# Patient Record
Sex: Female | Born: 1990 | Race: Black or African American | Hispanic: No | Marital: Single | Smoking: Current every day smoker
Health system: Southern US, Community
[De-identification: ages and names within clinical notes are randomized; demographics above are authoritative.]

## PROBLEM LIST (undated history)

## (undated) DIAGNOSIS — J45909 Unspecified asthma, uncomplicated: Secondary | ICD-10-CM

---

## 2014-08-17 ENCOUNTER — Encounter (HOSPITAL_COMMUNITY): Payer: Self-pay | Admitting: *Deleted

## 2014-08-17 ENCOUNTER — Emergency Department (HOSPITAL_COMMUNITY): Payer: Medicaid - Out of State

## 2014-08-17 ENCOUNTER — Emergency Department (INDEPENDENT_AMBULATORY_CARE_PROVIDER_SITE_OTHER)
Admission: EM | Admit: 2014-08-17 | Discharge: 2014-08-17 | Disposition: A | Discharge status: Nursing Home (Includes ICF) | Payer: PRIVATE HEALTH INSURANCE | Source: Home / Self Care

## 2014-08-17 ENCOUNTER — Encounter (HOSPITAL_COMMUNITY): Payer: Self-pay | Admitting: Emergency Medicine

## 2014-08-17 ENCOUNTER — Emergency Department (HOSPITAL_COMMUNITY)
Admission: EM | Admit: 2014-08-17 | Discharge: 2014-08-17 | Disposition: A | Discharge status: Home / Self Care | Payer: Medicaid - Out of State | Attending: Emergency Medicine | Admitting: Emergency Medicine

## 2014-08-17 DIAGNOSIS — R1011 Right upper quadrant pain: Secondary | ICD-10-CM | POA: Insufficient documentation

## 2014-08-17 DIAGNOSIS — Z720 Tobacco use: Secondary | ICD-10-CM | POA: Diagnosis not present

## 2014-08-17 DIAGNOSIS — Z79899 Other long term (current) drug therapy: Secondary | ICD-10-CM | POA: Diagnosis not present

## 2014-08-17 DIAGNOSIS — J45901 Unspecified asthma with (acute) exacerbation: Secondary | ICD-10-CM | POA: Diagnosis not present

## 2014-08-17 DIAGNOSIS — R0602 Shortness of breath: Secondary | ICD-10-CM

## 2014-08-17 HISTORY — DX: Unspecified asthma, uncomplicated: J45.909

## 2014-08-17 LAB — CBC WITH DIFFERENTIAL/PLATELET
Basophils Absolute: 0 10*3/uL (ref 0.0–0.1)
Basophils Relative: 0 % (ref 0–1)
Eosinophils Absolute: 0.1 10*3/uL (ref 0.0–0.7)
Eosinophils Relative: 1 % (ref 0–5)
HCT: 40.5 % (ref 36.0–46.0)
Hemoglobin: 13.8 g/dL (ref 12.0–15.0)
Lymphocytes Relative: 37 % (ref 12–46)
Lymphs Abs: 2.6 10*3/uL (ref 0.7–4.0)
MCH: 28.6 pg (ref 26.0–34.0)
MCHC: 34.1 g/dL (ref 30.0–36.0)
MCV: 84 fL (ref 78.0–100.0)
Monocytes Absolute: 0.4 10*3/uL (ref 0.1–1.0)
Monocytes Relative: 6 % (ref 3–12)
Neutro Abs: 3.8 10*3/uL (ref 1.7–7.7)
Neutrophils Relative %: 56 % (ref 43–77)
Platelets: 201 10*3/uL (ref 150–400)
RBC: 4.82 MIL/uL (ref 3.87–5.11)
RDW: 12.7 % (ref 11.5–15.5)
WBC: 6.9 10*3/uL (ref 4.0–10.5)

## 2014-08-17 LAB — COMPREHENSIVE METABOLIC PANEL
ALT: 16 U/L (ref 0–35)
AST: 20 U/L (ref 0–37)
Albumin: 4.5 g/dL (ref 3.5–5.2)
Alkaline Phosphatase: 38 U/L — ABNORMAL LOW (ref 39–117)
Anion gap: 10 (ref 5–15)
BUN: 7 mg/dL (ref 6–23)
CO2: 23 mmol/L (ref 19–32)
Calcium: 9.7 mg/dL (ref 8.4–10.5)
Chloride: 104 mmol/L (ref 96–112)
Creatinine, Ser: 0.85 mg/dL (ref 0.50–1.10)
GFR calc Af Amer: >90 mL/min (ref >90)
GFR calc non Af Amer: >90 mL/min (ref >90)
Glucose, Bld: 97 mg/dL (ref 70–99)
Potassium: 3.9 mmol/L (ref 3.5–5.1)
Sodium: 137 mmol/L (ref 135–145)
Total Bilirubin: 1.9 mg/dL — ABNORMAL HIGH (ref 0.3–1.2)
Total Protein: 7.7 g/dL (ref 6.0–8.3)

## 2014-08-17 LAB — URINALYSIS, ROUTINE W REFLEX MICROSCOPIC
Bilirubin Urine: NEGATIVE
Glucose, UA: NEGATIVE mg/dL
Hgb urine dipstick: NEGATIVE
Ketones, ur: >80 mg/dL — AB
Leukocytes, UA: NEGATIVE
Nitrite: NEGATIVE
Protein, ur: NEGATIVE mg/dL
Specific Gravity, Urine: 1.025 (ref 1.005–1.030)
Urobilinogen, UA: 0.2 mg/dL (ref 0.0–1.0)
pH: 6.5 (ref 5.0–8.0)

## 2014-08-17 LAB — POCT URINALYSIS DIP (DEVICE)
Glucose, UA: NEGATIVE mg/dL
Hgb urine dipstick: NEGATIVE
KETONES UR: 80 mg/dL — AB
Nitrite: NEGATIVE
PROTEIN: NEGATIVE mg/dL
Specific Gravity, Urine: 1.025 (ref 1.005–1.030)
UROBILINOGEN UA: 0.2 mg/dL (ref 0.0–1.0)
pH: 7 (ref 5.0–8.0)

## 2014-08-17 LAB — I-STAT BETA HCG BLOOD, ED (MC, WL, AP ONLY): I-stat hCG, quantitative: <5 m[IU]/mL (ref <5)

## 2014-08-17 LAB — LIPASE, BLOOD: Lipase: 26 U/L (ref 11–59)

## 2014-08-17 LAB — POCT PREGNANCY, URINE: PREG TEST UR: NEGATIVE

## 2014-08-17 MED ORDER — ONDANSETRON HCL 4 MG/2ML IJ SOLN
4.0000 mg | Freq: Once | INTRAMUSCULAR | Status: AC
  Administered 2014-08-17: 4 mg via INTRAVENOUS
  Filled 2014-08-17: qty 2

## 2014-08-17 MED ORDER — ONDANSETRON HCL 4 MG PO TABS: 4.0000 mg | ORAL_TABLET | Freq: Four times a day (QID) | ORAL | Status: AC

## 2014-08-17 MED ORDER — MORPHINE SULFATE 4 MG/ML IJ SOLN
4.0000 mg | Freq: Once | INTRAMUSCULAR | Status: AC
  Administered 2014-08-17: 4 mg via INTRAVENOUS
  Filled 2014-08-17: qty 1

## 2014-08-17 MED ORDER — HYDROCODONE-ACETAMINOPHEN 5-325 MG PO TABS: 1.0000 | ORAL_TABLET | Freq: Four times a day (QID) | ORAL | Status: AC | PRN

## 2014-08-17 NOTE — Discharge Instructions (Signed)
Return to the Emergency Department if you develop strong pain in the right lower part of your abdomen or if the pain in the right upper abdomen becomes unbearable.  Take pain medication as prescribed.  Do not drive or operate heavy machinery for 4-6 hours after taking pain medication.

## 2014-08-17 NOTE — ED Notes (Addendum)
Pt reports pain in upper right quadrant this morning with sob and loose stools for the past two days.  [facility] and was sent here for further evaluation of gall bladder.  Pt alert and oriented.

## 2014-08-17 NOTE — ED Notes (Signed)
Pt is here with complaints of sudden onset of pain under right breast last night radiating to right back. Pt denies any additional symptoms. Pt is A&O X 3, no signs of acute distress. Resp even and non labored.

## 2014-08-17 NOTE — ED Provider Notes (Signed)
CSN: [id number]     Arrival date & time 08/17/14  1844 History   First MD Initiated Contact with Patient 08/17/14 1901     Chief Complaint  Patient presents with  . Abdominal Pain     (Consider location/radiation/quality/duration/timing/severity/associated sxs/prior Treatment) HPI Comments: Patient presents today from [facility] for further evaluation of RUQ abdominal pain.  She reports that she began having the pain last evening.  The pain worsened this morning andwoke her up from sleep.  Pain located in the RUQ of the abdomen and does not radiate.  She states that she has never had pain like this before.  No history of Gallstones.  She states that she has been nauseous today so she has not been eating.  Therefore, she is unsure if the pain worsens after eating.  She has not taken anything for pain prior to arrival.  She reports associated nausea and SOB.  She denies vomiting, fever, chills, cough, chest pain, urinary symptoms, or diarrhea.   Patient is a 24 y.o. female presenting with abdominal pain. The history is provided by the patient.  Abdominal Pain   Past Medical History  Diagnosis Date  . Asthma     as a child   History reviewed. No pertinent past surgical history. History reviewed. No pertinent family history. History  Substance Use Topics  . Smoking status: Current Every Day Smoker -- 1.00 packs/day  . Smokeless tobacco: Not on file  . Alcohol Use: Yes   OB History    No data available     Review of Systems  Gastrointestinal: Positive for abdominal pain.  All other systems reviewed and are negative.     Allergies  Sulfa antibiotics  Home Medications   Prior to Admission medications   Medication Sig Start Date End Date Taking? Authorizing Provider  medroxyPROGESTERone (DEPO-PROVERA) 150 MG/ML injection Inject 150 mg into the muscle every 3 (three) months. 05/10/14  Yes Historical Provider, MD   BP 127/89 mmHg  Pulse 79  Temp(Src) 97.9 F (36.6 C) (Oral)  Resp 18   SpO2 100%  LMP  (LMP Unknown) Physical Exam  Constitutional: She appears well-developed and well-nourished. No distress.  HENT:  Head: Normocephalic and atraumatic.  Mouth/Throat: Oropharynx is clear and moist.  Neck: Normal range of motion. Neck supple.  Cardiovascular: Normal rate, regular rhythm and normal heart sounds.   Pulmonary/Chest: Effort normal and breath sounds normal. No respiratory distress. She has no wheezes. She has no rales.  Abdominal: Soft. Bowel sounds are normal. She exhibits no distension and no mass. There is tenderness in the right upper quadrant. There is no rigidity, no rebound, no guarding and negative Murphy's sign.  Musculoskeletal: Normal range of motion.  Neurological: She is alert.  Skin: Skin is warm and dry. She is not diaphoretic.  Psychiatric: She has a normal mood and affect.  Nursing note and vitals reviewed.   ED Course  Procedures (including critical care time) Labs Review Labs Reviewed  COMPREHENSIVE METABOLIC PANEL - Abnormal; Notable for the following:    Alkaline Phosphatase 38 (*)    Total Bilirubin 1.9 (*)    All other components within normal limits  URINALYSIS, ROUTINE W REFLEX MICROSCOPIC - Abnormal; Notable for the following:    Ketones, ur >80 (*)    All other components within normal limits  CBC WITH DIFFERENTIAL/PLATELET  LIPASE, BLOOD  I-STAT BETA HCG BLOOD, ED ([facility], [facility], [facility] ONLY)    Imaging Review Dg Chest 2 View  08/17/2014   CLINICAL  DATA:  Shortness of breath and right lower chest pain radiating to the patient's back since this morning. Occasional smoker.  EXAM: CHEST  2 VIEW  COMPARISON:  None.  FINDINGS: Pulmonary hyperinflation. The heart size and mediastinal contours are within normal limits. Both lungs are clear. The visualized skeletal structures are unremarkable.  IMPRESSION: No active cardiopulmonary disease.   Electronically Signed   By: Burman NievesWilliam  Stevens M.D.   On: 08/17/2014 19:47   [place] Abdomen  Complete  08/17/2014   CLINICAL DATA:  Initial evaluation for acute abdominal pain.  EXAM: ULTRASOUND ABDOMEN COMPLETE  COMPARISON:  None.  FINDINGS: Gallbladder: No gallstones or wall thickening visualized. No sonographic Murphy sign noted.  Common bile duct: Diameter: 2.3 mm  Liver: No focal lesion identified. Within normal limits in parenchymal echogenicity.  IVC: No abnormality visualized.  Pancreas: Visualized portion unremarkable.  Spleen: Size and appearance within normal limits.  Right Kidney: Length: 9.2 cm. Echogenicity within normal limits. No mass or hydronephrosis visualized.  Left Kidney: Length: 10.2 cm. Echogenicity within normal limits. No mass or hydronephrosis visualized.  Abdominal aorta: No aneurysm visualized.  Other findings: None.  IMPRESSION: Normal abdominal ultrasound with no acute abnormality identified.   Electronically Signed   By: Rise MuBenjamin  McClintock M.D.   On: 08/17/2014 20:58     EKG Interpretation None     9:29 PM Reassessed patient.  Pain and nausea have improved.  Abdomen soft with mild tenderness to palpation of the RUQ.  No rebound or guarding. MDM   Final diagnoses:  SOB (shortness of breath)   Patient presents today with a chief complaint of RUQ abdominal pain.  Pain has been present since his morning.  Pain associated with some nausea.  Labs unremarkable aside from mildly elevated total bilirubin of 1.9.  Abdominal ultrasound is negative.  Urine pregnancy negative.  Pain and nausea controlled in the ED.  Patient given referral to GI.  Stable for discharge.  Return precautions given.    Santiago GladHeather Sophee Mckimmy, PA-C 08/18/14 [id number]  Raeford RazorStephen Kohut, MD 08/22/14 [phone]

## 2014-08-17 NOTE — ED Notes (Signed)
Pt c/o RUQ pain x 2 days; [facility]

## 2014-08-17 NOTE — ED Provider Notes (Signed)
CSN: [id number]     Arrival date & time 08/17/14  1500 History   None    Chief Complaint  Patient presents with  . Chest Pain  . Back Pain  . Cystitis   (Consider location/radiation/quality/duration/timing/severity/associated sxs/prior Treatment)  HPI   The patient is a 24 year old female presenting tonight with complaints of right upper quadrant and "side pain"  The patient states that her discomfort radiates through towards her back and into her right shoulder. She rates her pain as a 7-8/10 on scale. In addition, she states she has some lower abdominal cramping, but severe pain is centered in the right upper quadrant. She states the pain was so severe it woke her up from a sound sleep at approximately 12:30 today. She went to sleep at 7 AM this morning. Denies any history of hematuria, dysuria, frequency, vaginal discharge or odor. Last menstrual period was 05/10/2014. Patient is on Depo-Provera and has not had regular periods. Denies nausea, vomiting, and diarrhea. States her last bowel movement was this morning and "normal for her". Denies headache or neck stiffness.  Past Medical History  Diagnosis Date  . Asthma     as a child   History reviewed. No pertinent past surgical history. No family history on file. History  Substance Use Topics  . Smoking status: Current Every Day Smoker -- 1.00 packs/day  . Smokeless tobacco: Not on file  . Alcohol Use: Yes   OB History    No data available     Review of Systems  Constitutional: Negative.  Negative for fever, chills and fatigue.  HENT: Negative.   Eyes: Negative.   Respiratory: Negative.  Negative for cough, chest tightness, shortness of breath and wheezing.   Gastrointestinal: Positive for abdominal pain. Negative for nausea, vomiting, diarrhea, constipation and abdominal distention.  Endocrine: Negative.   Genitourinary: Negative for dysuria, urgency, frequency, hematuria, flank pain, decreased urine volume, vaginal  discharge, difficulty urinating, genital sores, menstrual problem and pelvic pain.  Skin: Negative.  Negative for rash.  Allergic/Immunologic: Negative.   Neurological: Negative.   Hematological: Negative.   Psychiatric/Behavioral: Negative.     Allergies  Sulfa antibiotics  Home Medications   Prior to Admission medications   Not on File   BP 113/77 mmHg  Pulse 79  Temp(Src) 97.9 F (36.6 C) (Oral)  Resp 16  SpO2 100%  LMP  (LMP Unknown)   Physical Exam  Constitutional: She appears well-developed and well-nourished.  The patient is rocking back and forth on the end of the examination table and appears to be acutely uncomfortable.  Neck: Normal range of motion. Neck supple.  Negative nuchal rigidity.  Cardiovascular: Normal rate, regular rhythm, normal heart sounds and intact distal pulses.  Exam reveals no gallop and no friction rub.   No murmur heard. Pulmonary/Chest: Effort normal and breath sounds normal. No respiratory distress. She has no wheezes. She has no rales. She exhibits no tenderness.  No adventitious breath sounds noted. Air exchange heard in all fields.  Abdominal: Soft. Normal appearance and bowel sounds are normal. She exhibits no distension and no mass. There is no hepatosplenomegaly, splenomegaly or hepatomegaly. There is tenderness in the right upper quadrant. There is positive Murphy's sign. There is no rigidity, no rebound, no CVA tenderness and no tenderness at McBurney's point.  Patient describes pain as under her R breast.   Skin: Skin is warm and dry. She is not diaphoretic. No pallor.  Nursing note and vitals reviewed.   ED Course  Procedures (including critical care time) Labs Review Labs Reviewed  POCT URINALYSIS DIP (DEVICE) - Abnormal; Notable for the following:    Bilirubin Urine SMALL (*)    Ketones, ur 80 (*)    Leukocytes, UA SMALL (*)    All other components within normal limits  POCT PREGNANCY, URINE   Results for orders placed or  performed during the hospital encounter of 08/17/14  POCT urinalysis dip (device)  Result Value Ref Range   Glucose, UA NEGATIVE NEGATIVE mg/dL   Bilirubin Urine SMALL (A) NEGATIVE   Ketones, ur 80 (A) NEGATIVE mg/dL   Specific Gravity, Urine 1.025 1.005 - 1.030   Hgb urine dipstick NEGATIVE NEGATIVE   pH 7.0 5.0 - 8.0   Protein, ur NEGATIVE NEGATIVE mg/dL   Urobilinogen, UA 0.2 0.0 - 1.0 mg/dL   Nitrite NEGATIVE NEGATIVE   Leukocytes, UA SMALL (A) NEGATIVE  Pregnancy, urine POC  Result Value Ref Range   Preg Test, Ur NEGATIVE NEGATIVE    Imaging Review No results found.   MDM   1. Right upper quadrant pain    I am concerned patient has gallbladder disease.  [facility] evaluation and workup.      Servando Salinaatherine H Daimien Patmon, NP 08/17/14 [phone]

## 2015-08-09 IMAGING — US US ABDOMEN COMPLETE
1 series · 14 of 25 positions shown · non-contrast
Comparison: None.

CLINICAL DATA: Initial evaluation for acute abdominal pain.

EXAM:
ULTRASOUND ABDOMEN COMPLETE

[Series 1: us abdomen complete · 0.21mm/px · 14 of 44 slices shown]
[im 1/44]
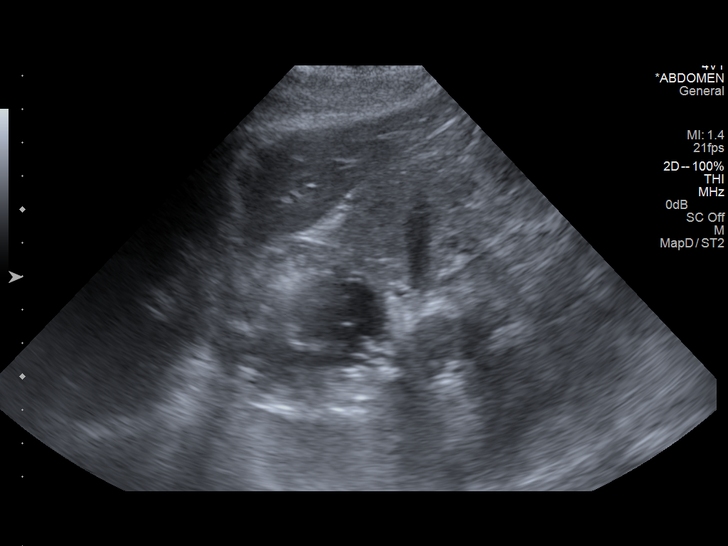
[im 4/44]
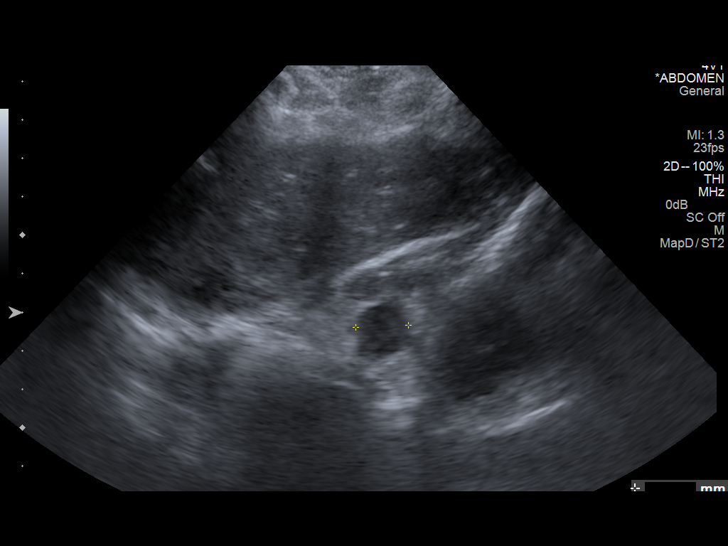
[im 8/44]
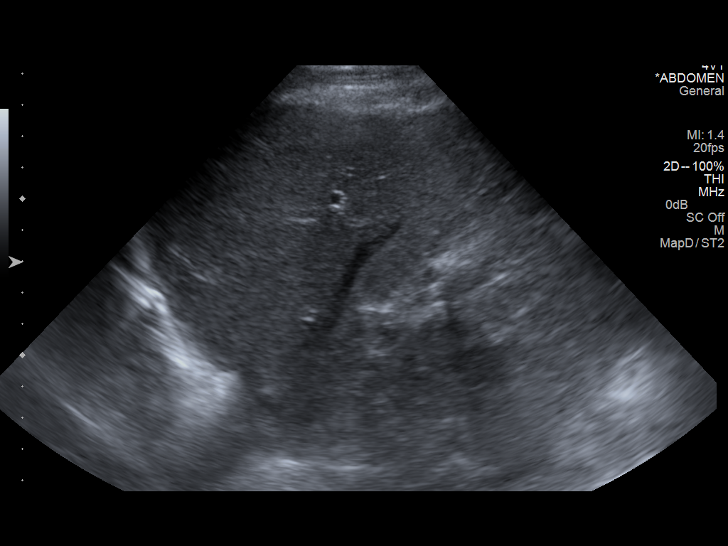
[im 11/44]
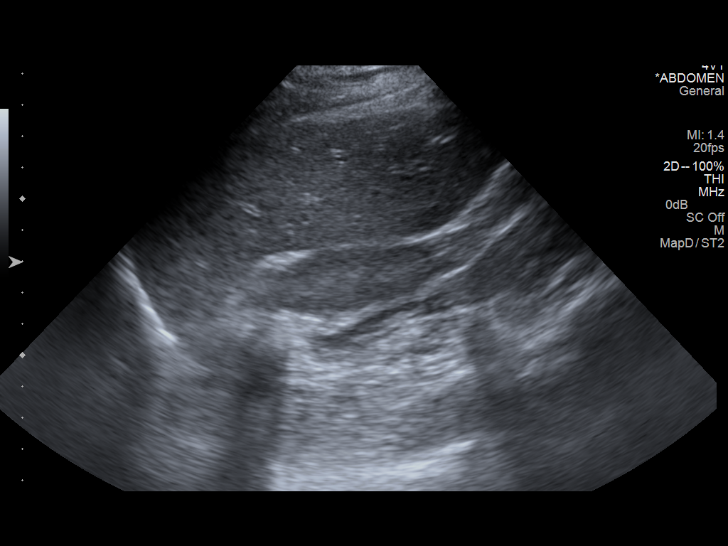
[im 15/44]
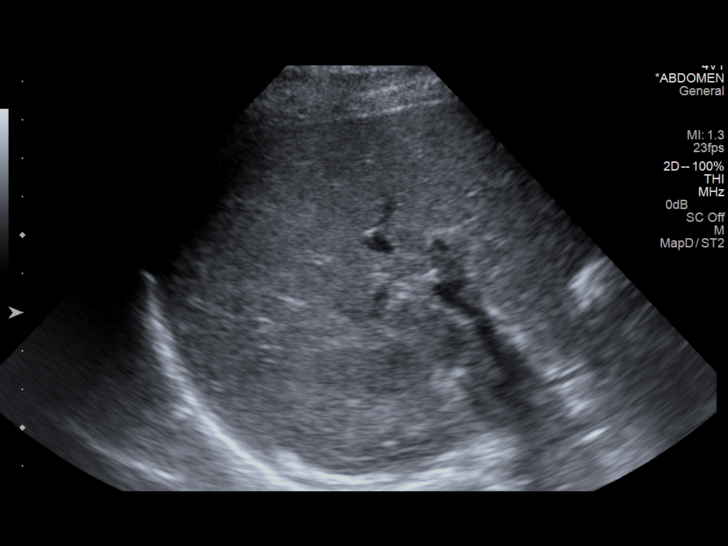
[im 17/44]
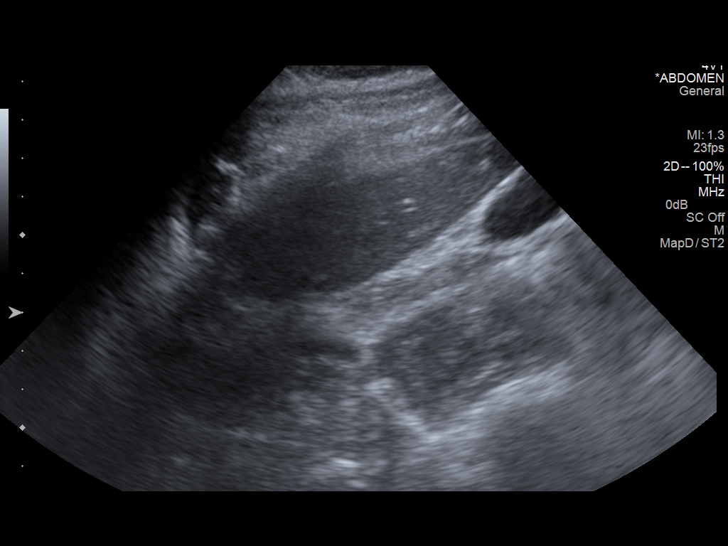
[im 20/44]
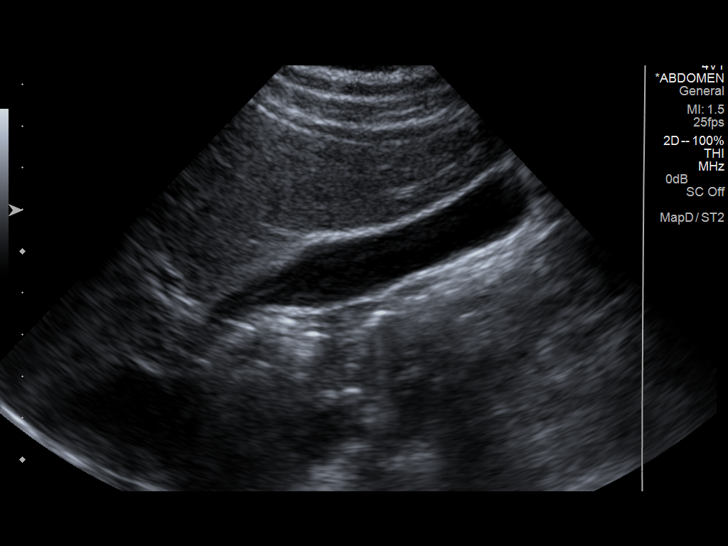
[im 24/44]
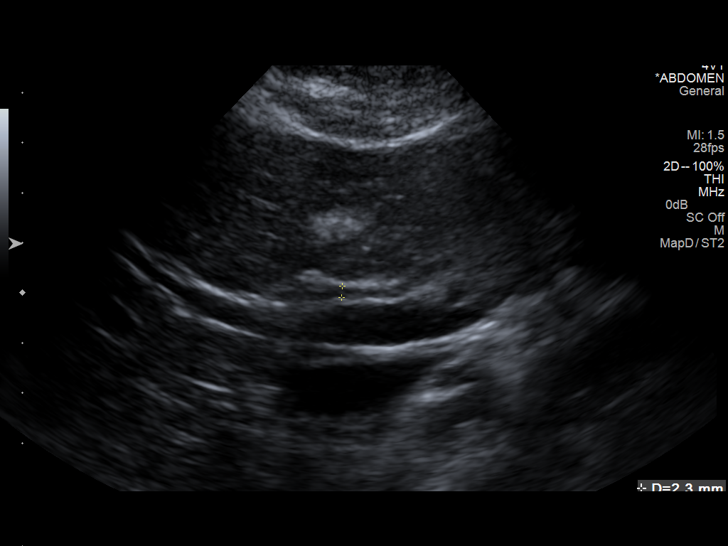
[im 27/44]
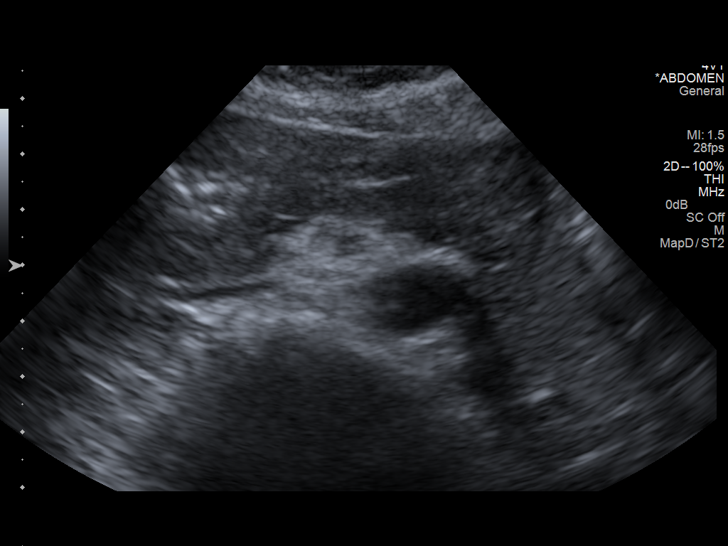
[im 29/44]
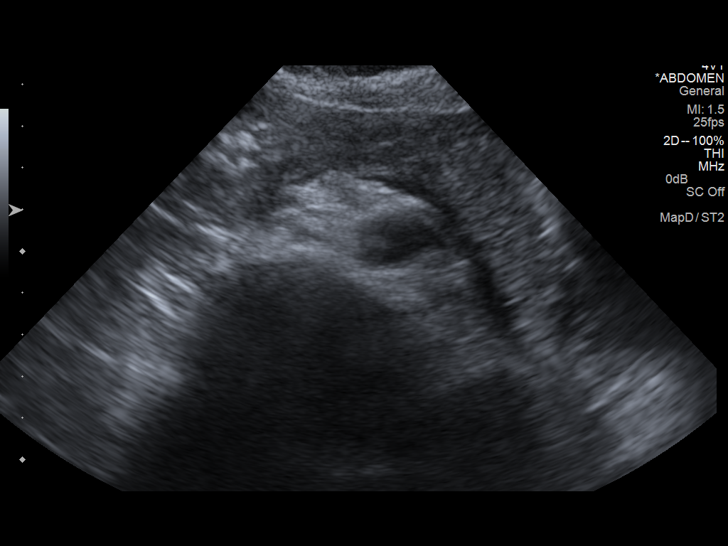
[im 33/44]
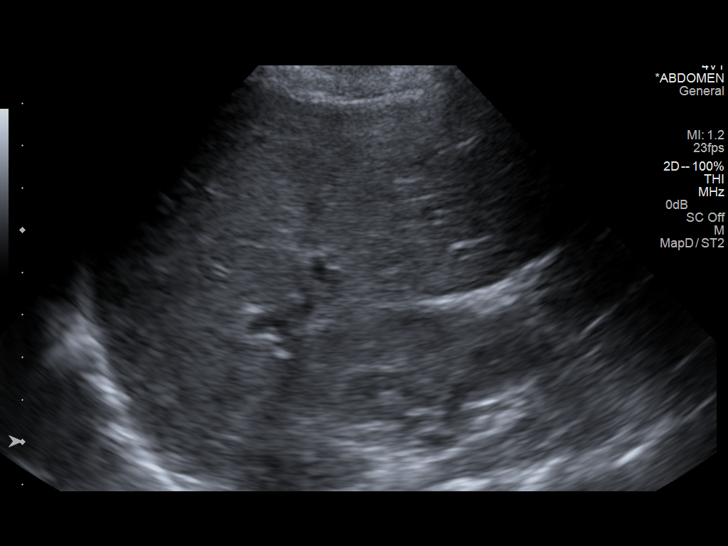
[im 36/44]
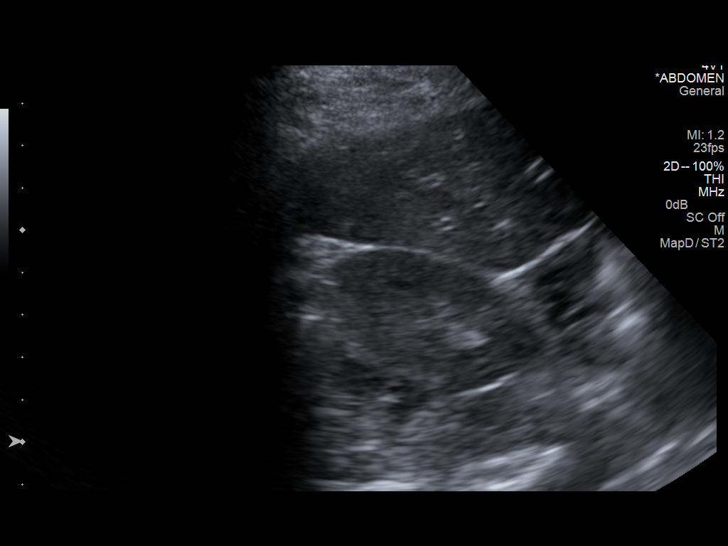
[im 40/44]
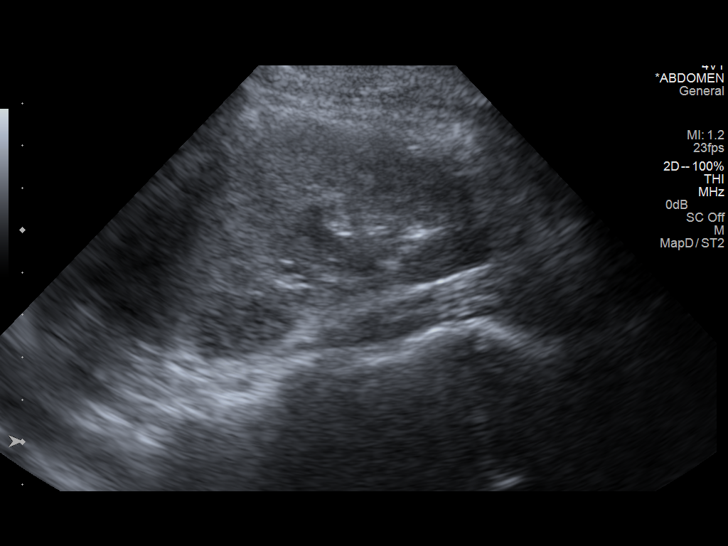
[im 44/44]
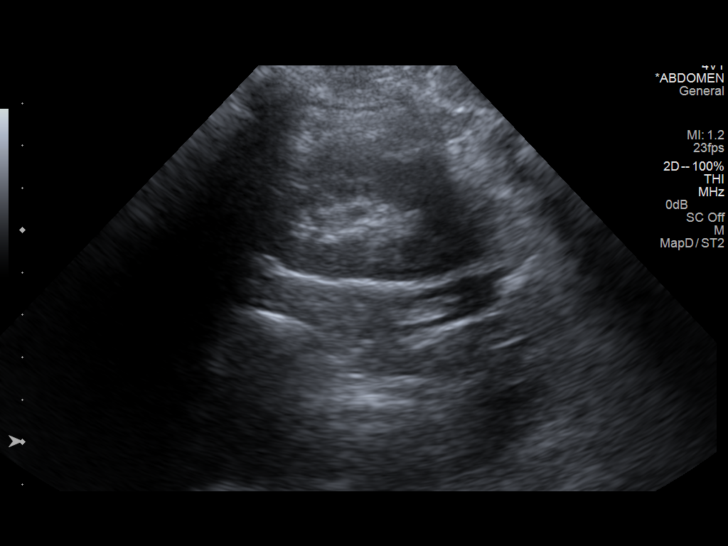

[14 of 25 positions shown; findings below may reference images not displayed]

FINDINGS: Gallbladder: No gallstones or wall thickening visualized. No
sonographic Murphy sign noted.

Common bile duct: Diameter: 2.3 mm

Liver: No focal lesion identified. Within normal limits in
parenchymal echogenicity.

IVC: No abnormality visualized.

Pancreas: Visualized portion unremarkable.

Spleen: Size and appearance within normal limits.

Right Kidney: Length: 9.2 cm. Echogenicity within normal limits. No
mass or hydronephrosis visualized.

Left Kidney: Length: 10.2 cm. Echogenicity within normal limits. No
mass or hydronephrosis visualized.

Abdominal aorta: No aneurysm visualized.

Other findings: None.
IMPRESSION: Normal abdominal ultrasound with no acute abnormality identified.

## 2015-08-09 IMAGING — DX DG CHEST 2V
2 series · 2 of 2 positions shown · non-contrast
Comparison: None.

CLINICAL DATA: Shortness of breath and right lower chest pain
radiating to the patient's back since this morning. Occasional
smoker.

EXAM:
CHEST  2 VIEW

[chest pa]
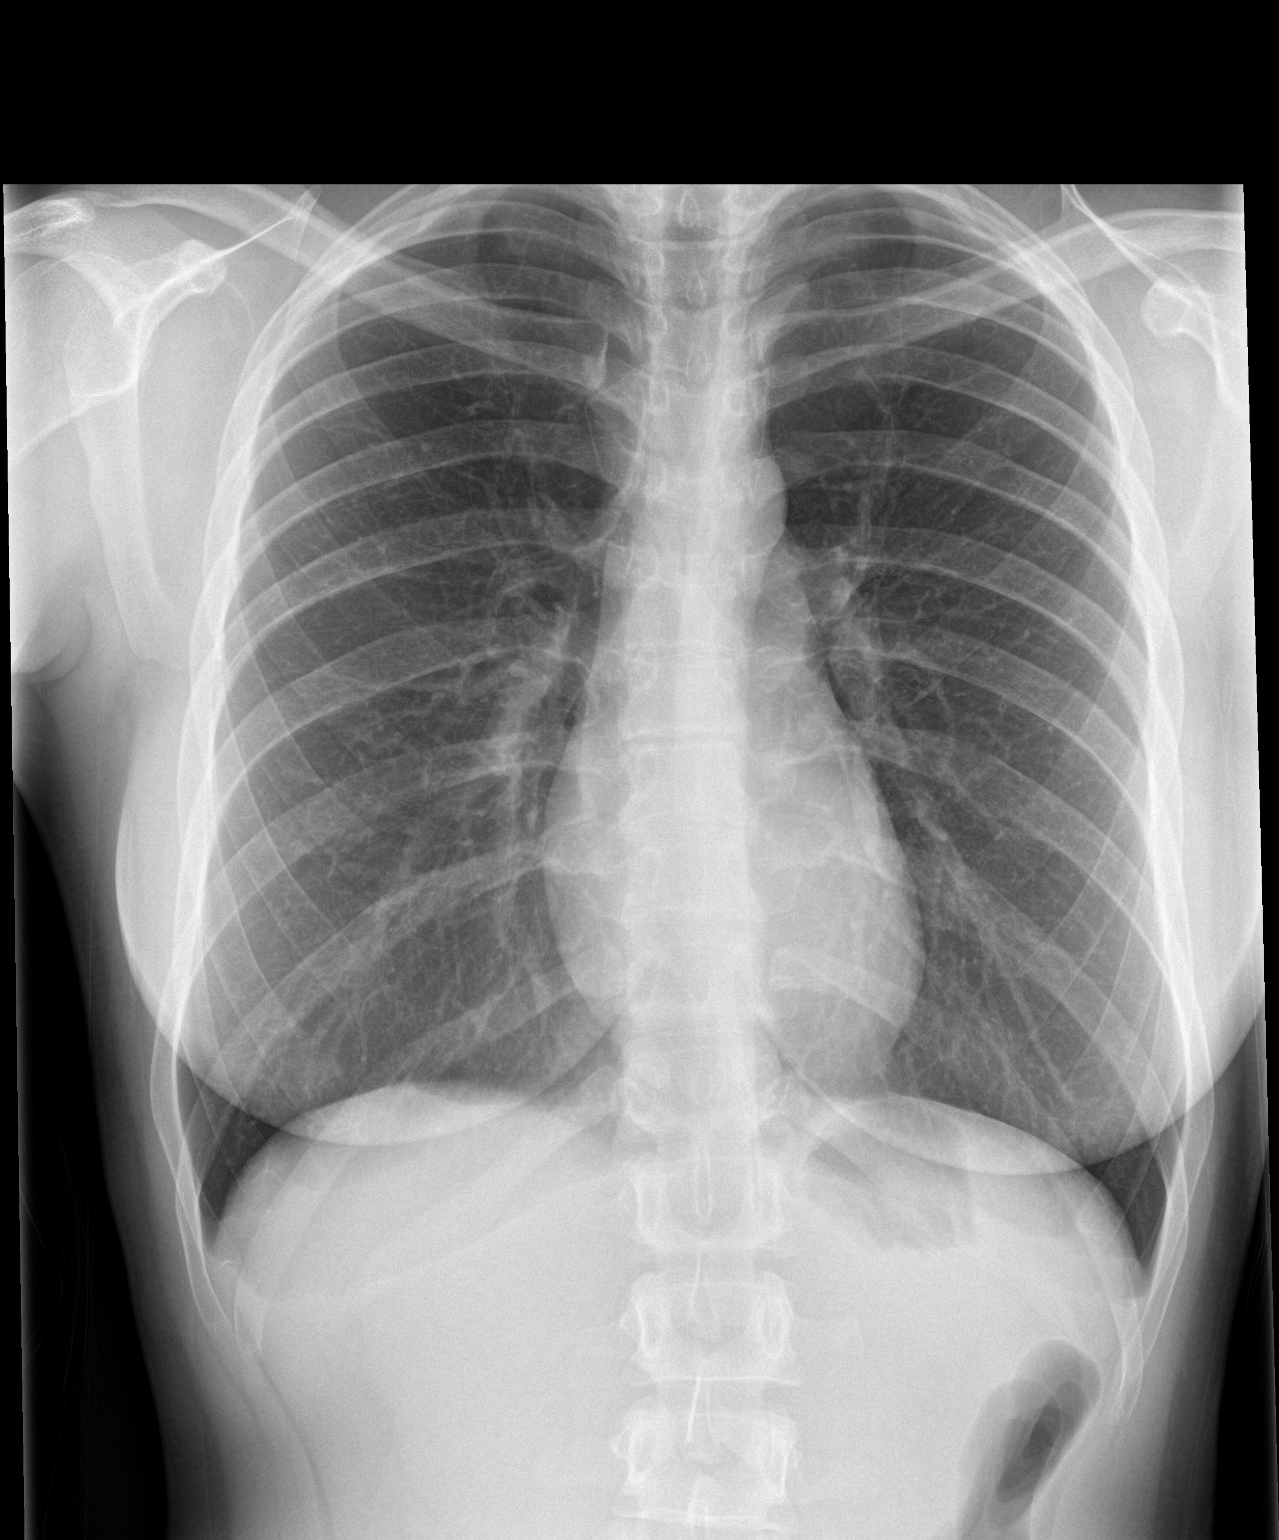

[chest lat]
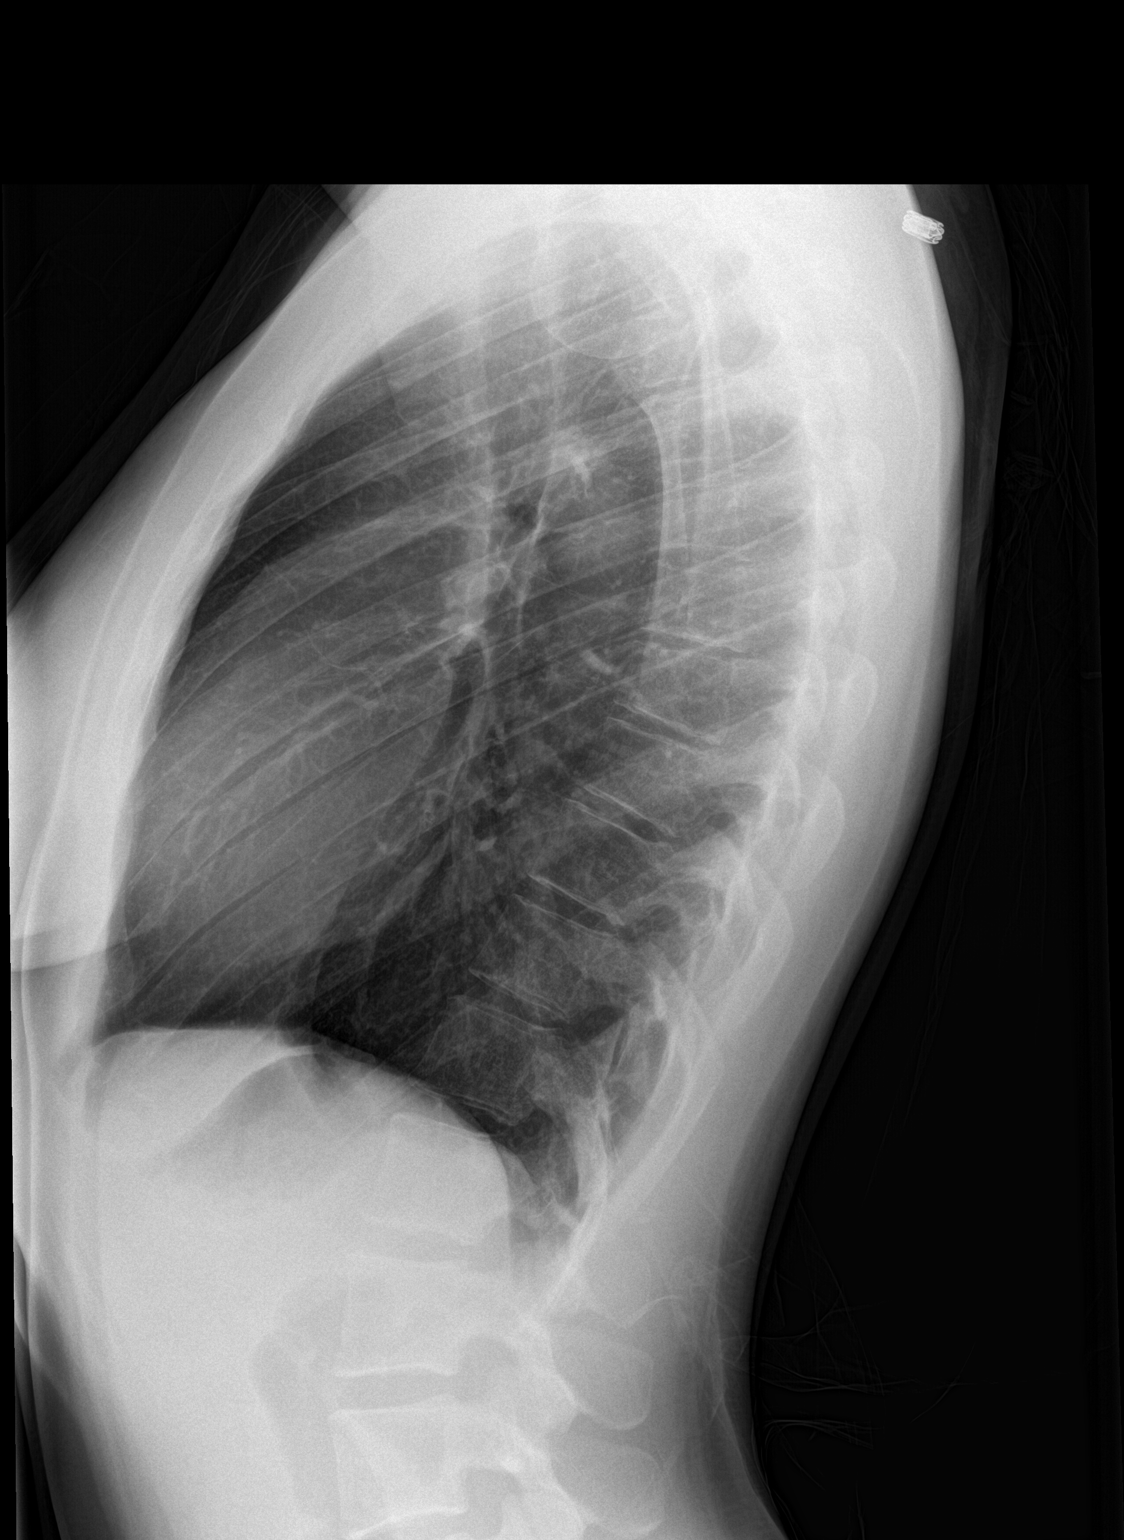

[2 of 2 positions shown; findings below may reference images not displayed]

FINDINGS: Pulmonary hyperinflation. The heart size and mediastinal contours
are within normal limits. Both lungs are clear. The visualized
skeletal structures are unremarkable.
IMPRESSION: No active cardiopulmonary disease.
